# Patient Record
Sex: Female | Born: 1990 | Race: White | Hispanic: No | Marital: Married | Smoking: Never smoker
Health system: Southern US, Community
[De-identification: ages and names within clinical notes are randomized; demographics above are authoritative.]

## PROBLEM LIST (undated history)

## (undated) ENCOUNTER — Emergency Department (HOSPITAL_COMMUNITY): Admission: EM | Payer: Self-pay | Source: Home / Self Care

## (undated) DIAGNOSIS — Z789 Other specified health status: Secondary | ICD-10-CM

---
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
## 2010-09-29 NOTE — Miscellaneous (Signed)
Summary: Vaccine Record/[facility]  Vaccine Record/[facility]   Imported By: Lanelle Bal 01/14/2010 10:00:27  _____________________________________________________________________  External Attachment:    Type:   Image     Comment:   External Document

## 2010-09-29 NOTE — Letter (Signed)
Summary: Immunization Form/[facility]  Immunization Form/[facility]   Imported By: Lanelle Bal 01/14/2010 09:59:39  _____________________________________________________________________  External Attachment:    Type:   Image     Comment:   External Document
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
## 2014-05-01 LAB — OB RESULTS CONSOLE RUBELLA ANTIBODY, IGM: Rubella: UNDETERMINED

## 2014-05-01 LAB — OB RESULTS CONSOLE ABO/RH: RH Type: POSITIVE

## 2014-05-01 LAB — OB RESULTS CONSOLE HEPATITIS B SURFACE ANTIGEN: Hepatitis B Surface Ag: NEGATIVE

## 2014-05-01 LAB — OB RESULTS CONSOLE RPR: RPR: NONREACTIVE

## 2014-05-01 LAB — OB RESULTS CONSOLE ANTIBODY SCREEN: Antibody Screen: NEGATIVE

## 2014-05-01 LAB — OB RESULTS CONSOLE HIV ANTIBODY (ROUTINE TESTING): HIV: NONREACTIVE

## 2014-05-01 LAB — OB RESULTS CONSOLE GC/CHLAMYDIA
CHLAMYDIA, DNA PROBE: NEGATIVE
Gonorrhea: NEGATIVE

## 2014-05-21 ENCOUNTER — Encounter (HOSPITAL_COMMUNITY): Payer: Self-pay | Admitting: Emergency Medicine

## 2014-05-21 ENCOUNTER — Emergency Department (HOSPITAL_COMMUNITY)
Admission: EM | Admit: 2014-05-21 | Discharge: 2014-05-21 | Disposition: A | Discharge status: Home / Self Care | Payer: BC Managed Care – PPO | Attending: Emergency Medicine | Admitting: Emergency Medicine

## 2014-05-21 DIAGNOSIS — S59919A Unspecified injury of unspecified forearm, initial encounter: Secondary | ICD-10-CM

## 2014-05-21 DIAGNOSIS — S40812A Abrasion of left upper arm, initial encounter: Secondary | ICD-10-CM

## 2014-05-21 DIAGNOSIS — S59909A Unspecified injury of unspecified elbow, initial encounter: Secondary | ICD-10-CM | POA: Insufficient documentation

## 2014-05-21 DIAGNOSIS — Y9389 Activity, other specified: Secondary | ICD-10-CM | POA: Insufficient documentation

## 2014-05-21 DIAGNOSIS — S6990XA Unspecified injury of unspecified wrist, hand and finger(s), initial encounter: Secondary | ICD-10-CM

## 2014-05-21 DIAGNOSIS — Y92410 Unspecified street and highway as the place of occurrence of the external cause: Secondary | ICD-10-CM | POA: Diagnosis not present

## 2014-05-21 DIAGNOSIS — IMO0002 Reserved for concepts with insufficient information to code with codable children: Secondary | ICD-10-CM | POA: Insufficient documentation

## 2014-05-21 NOTE — ED Notes (Signed)
Spoke in detail with patient regarding followup regarding abdominal pain and vaginal bleeding
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
## 2014-05-21 NOTE — ED Provider Notes (Signed)
Medical screening examination/treatment/procedure(s) were performed by non-physician practitioner and as supervising physician I was immediately available for consultation/collaboration.   Nelia Shi, MD 05/21/14 2037

## 2014-06-30 ENCOUNTER — Encounter (HOSPITAL_COMMUNITY): Payer: Self-pay | Admitting: Emergency Medicine

## 2014-08-29 NOTE — L&D Delivery Note (Signed)
Delivery Note  SVD viable female Apgars 8,8 over intact perineum.  Port wine fluid noted. NICU team in attendance due to prematurity.  Placenta delivered spontaneously intact with 3VC and velamentous insertion.  Also clot on edge of placenta c/w marginal abruption. Good hemostasis noted.  Baby allowed to stay with mother.   PH art was sent.  Mother and baby were doing well.  EBL 300cc  Candice Campavid Kden Wagster, MD

## 2014-11-01 ENCOUNTER — Inpatient Hospital Stay (HOSPITAL_COMMUNITY)
Admission: AD | Admit: 2014-11-01 | Discharge: 2014-11-04 | DRG: 775 | Disposition: A | Discharge status: Home / Self Care | Payer: BLUE CROSS/BLUE SHIELD | Source: Ambulatory Visit | Attending: Obstetrics and Gynecology | Admitting: Obstetrics and Gynecology

## 2014-11-01 ENCOUNTER — Encounter (HOSPITAL_COMMUNITY): Payer: Self-pay | Admitting: *Deleted

## 2014-11-01 DIAGNOSIS — Z3689 Encounter for other specified antenatal screening: Secondary | ICD-10-CM | POA: Insufficient documentation

## 2014-11-01 DIAGNOSIS — Z3A34 34 weeks gestation of pregnancy: Secondary | ICD-10-CM | POA: Diagnosis not present

## 2014-11-01 DIAGNOSIS — O42913 Preterm premature rupture of membranes, unspecified as to length of time between rupture and onset of labor, third trimester: Principal | ICD-10-CM | POA: Diagnosis present

## 2014-11-01 DIAGNOSIS — O42919 Preterm premature rupture of membranes, unspecified as to length of time between rupture and onset of labor, unspecified trimester: Secondary | ICD-10-CM | POA: Insufficient documentation

## 2014-11-01 HISTORY — DX: Other specified health status: Z78.9

## 2014-11-01 LAB — URINALYSIS, ROUTINE W REFLEX MICROSCOPIC
Bilirubin Urine: NEGATIVE
GLUCOSE, UA: NEGATIVE mg/dL
KETONES UR: NEGATIVE mg/dL
Leukocytes, UA: NEGATIVE
NITRITE: NEGATIVE
PH: 6.5 (ref 5.0–8.0)
Protein, ur: NEGATIVE mg/dL
Specific Gravity, Urine: <1.005 — ABNORMAL LOW (ref 1.005–1.030)
Urobilinogen, UA: 0.2 mg/dL (ref 0.0–1.0)

## 2014-11-01 LAB — URINE MICROSCOPIC-ADD ON

## 2014-11-01 LAB — POCT FERN TEST: POCT FERN TEST: POSITIVE

## 2014-11-01 LAB — AMNISURE RUPTURE OF MEMBRANE (ROM) NOT AT ARMC: Amnisure ROM: POSITIVE

## 2014-11-01 NOTE — MAU Provider Note (Signed)
History     CSN: [id number]  Arrival date and time: 11/01/14 2156   First Provider Initiated Contact with Patient 11/01/14 2248      Chief Complaint  Patient presents with  . Contractions   HPI Alyssa Davila 24 y.o. G1P0 @[redacted]w[redacted]d  presents to MAU complaining of contractions that have been consistent since 4pm.  Initially they were 8 minutes apart but with drinking lots of water, they spread out a bit and now have increased again.  She has been having LOF x several days.  She was seen in the office since this started and diagnosed with a yeast infection.  She is using cream for this and continues to feel fluid coming out.  She endorses good fetal movement.  She has seen a few drops of red blood in the toilet when she goes and a small amt on the pad she has been wearing.  She denies dysuria, nausea, vomiting, weakness, headache.   OB History    Gravida Para Term Preterm AB TAB SAB Ectopic Multiple Living   1               History reviewed. No pertinent past medical history.  History reviewed. No pertinent past surgical history.  History reviewed. No pertinent family history.  History  Substance Use Topics  . Smoking status: Never Smoker   . Smokeless tobacco: Not on file  . Alcohol Use: No    Allergies:  Allergies  Allergen Reactions  . Sulfonamide Derivatives     REACTION: Rash, swelling    Prescriptions prior to admission  Medication Sig Dispense Refill Last Dose  . miconazole (MONISTAT 7) 2 % vaginal cream Place 1 Applicatorful vaginally at bedtime.   10/31/2014 at Unknown time  . OVER THE COUNTER MEDICATION Apply 1 application topically 2 (two) times daily. otc ringworm topical cream   05/21/2014 at Unknown time  . Prenatal Vit-FePoly-FA-DHA (SELECT-OB+DHA) 29-1 & 250 MG MISC Take 1 tablet by mouth daily.   11/01/2014 at Unknown time    ROS Pertinent ROS in HPI  Physical Exam   Blood pressure 125/74, pulse 89, temperature 98.5 F (36.9 C), resp. rate 18, height 5\' 2"   (1.575 m), weight 142 lb 3.2 oz (64.501 kg).  Physical Exam  Constitutional: She is oriented to person, place, and time. She appears well-developed and well-nourished. No distress.  HENT:  Head: Atraumatic.  Eyes: EOM are normal.  Neck: Normal range of motion.  Cardiovascular: Normal rate and regular rhythm.   Respiratory: Effort normal and breath sounds normal. No respiratory distress.  GI: Soft. Bowel sounds are normal. She exhibits no distension. There is no tenderness. There is no rebound and no guarding.  Genitourinary:  Pooling of bloody liquid noted in vault.  After this was removed, pt was asked to cough and further blood was seen exiting cervical os.  Pooling noted of very thin red fluid.   Cervical exam reveals fingertip dilation, 40% effacement  Musculoskeletal: Normal range of motion.  Neurological: She is alert and oriented to person, place, and time.  Skin: Skin is warm and dry.  Psychiatric: She has a normal mood and affect.   Fern POSITIVE MAU Course  Procedures Fetal tracing is reactive without regular contraction pattern.  MDM Katie, RN has discussed with fern positive and bleeding with Dr. Rana SnareLowe [facility].  Amnisure (collected by RN)  was positive also.   MD discussed pt briefly with PA and advises for U/S to confirm fetal positioning.  MD aware  of high census in NICU necessitating consult with neonatology prior to admit. [place] confirms - Vertex.   RN to discuss with Dr. Rana Snare for admit Assessment and Plan  A: Rupture of membranes  P: Admit to L&D  Bertram Denver 11/01/2014, 10:50 PM

## 2014-11-01 NOTE — MAU Note (Addendum)
Contractions since 1600. Drank a lot of water and spaced out some but now about apart. Some spotting for last couple days since being checked at office. . Currently being treated for yeast infecton. States has been leaking fld for awhile. Told doctor Thurs and was told everything ok. Has u/s and fld level normal

## 2014-11-02 ENCOUNTER — Inpatient Hospital Stay (HOSPITAL_COMMUNITY): Payer: BLUE CROSS/BLUE SHIELD | Admitting: Anesthesiology

## 2014-11-02 ENCOUNTER — Inpatient Hospital Stay (HOSPITAL_COMMUNITY): Payer: BLUE CROSS/BLUE SHIELD

## 2014-11-02 ENCOUNTER — Encounter (HOSPITAL_COMMUNITY): Payer: Self-pay | Admitting: *Deleted

## 2014-11-02 DIAGNOSIS — Z3A34 34 weeks gestation of pregnancy: Secondary | ICD-10-CM | POA: Diagnosis present

## 2014-11-02 DIAGNOSIS — O42913 Preterm premature rupture of membranes, unspecified as to length of time between rupture and onset of labor, third trimester: Principal | ICD-10-CM

## 2014-11-02 LAB — RPR: RPR Ser Ql: NONREACTIVE

## 2014-11-02 LAB — CBC
HCT: 32.9 % — ABNORMAL LOW (ref 36.0–46.0)
Hemoglobin: 10.9 g/dL — ABNORMAL LOW (ref 12.0–15.0)
MCH: 29.4 pg (ref 26.0–34.0)
MCHC: 33.1 g/dL (ref 30.0–36.0)
MCV: 88.7 fL (ref 78.0–100.0)
Platelets: 233 10*3/uL (ref 150–400)
RBC: 3.71 MIL/uL — AB (ref 3.87–5.11)
RDW: 12 % (ref 11.5–15.5)
WBC: 13.5 10*3/uL — ABNORMAL HIGH (ref 4.0–10.5)

## 2014-11-02 LAB — TYPE AND SCREEN
ABO/RH(D): O POS
Antibody Screen: NEGATIVE

## 2014-11-02 LAB — ABO/RH: ABO/RH(D): O POS

## 2014-11-02 IMAGING — US US OB LIMITED
1 series · 13 of 17 positions shown · non-contrast
Comparison: none

[Series 1: us ob limited · 0.24mm/px · 13 of 17 slices shown]
[im 1/17]
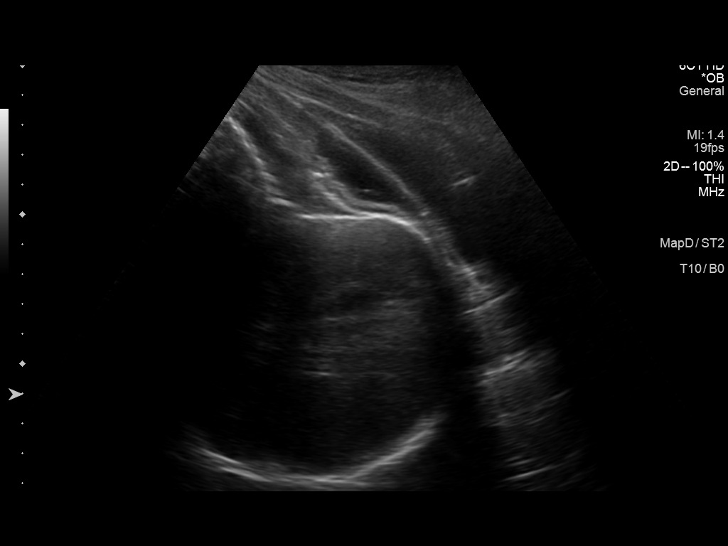
[im 2/17]
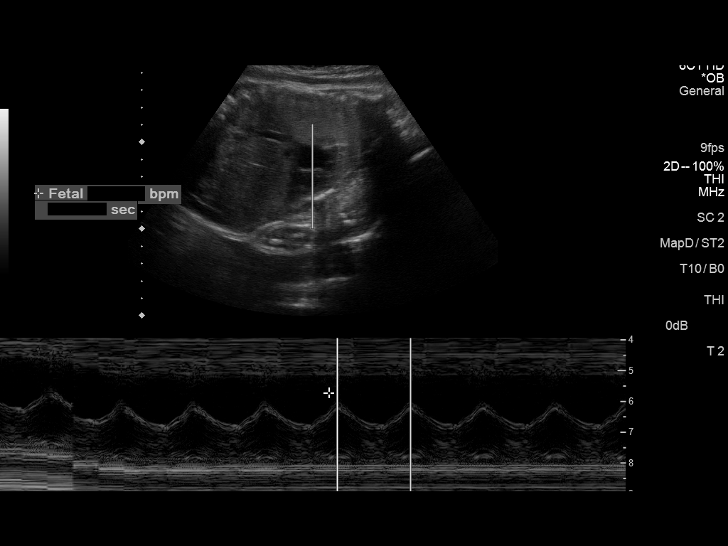
[im 4/17]
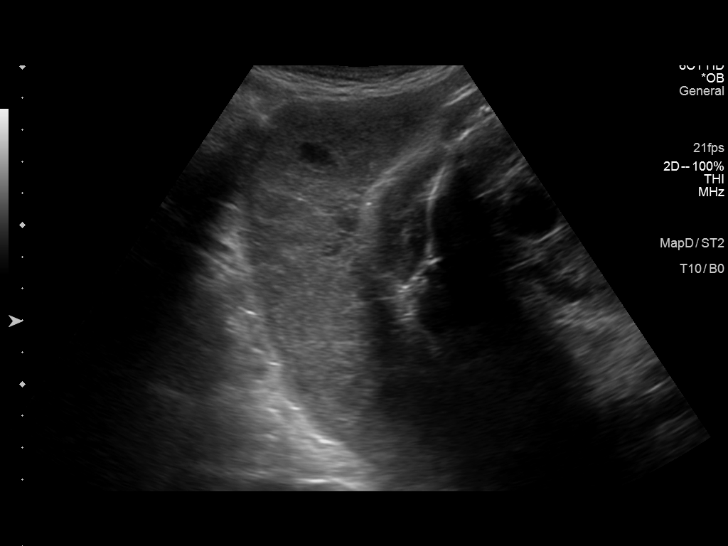
[im 5/17]
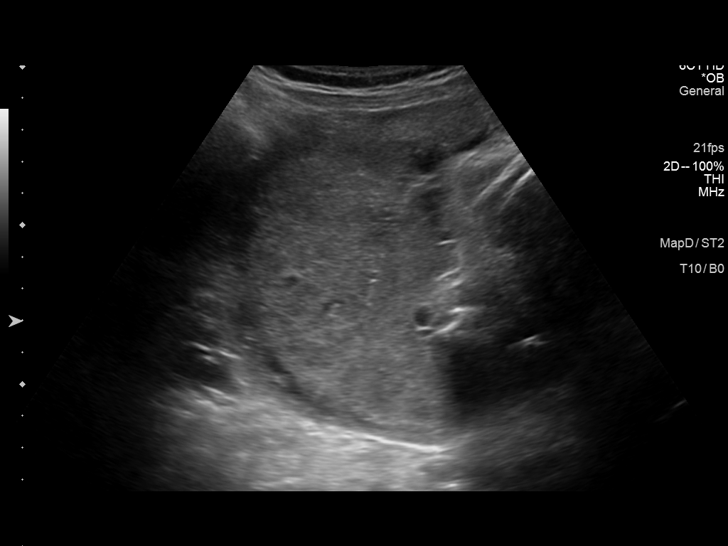
[im 6/17]
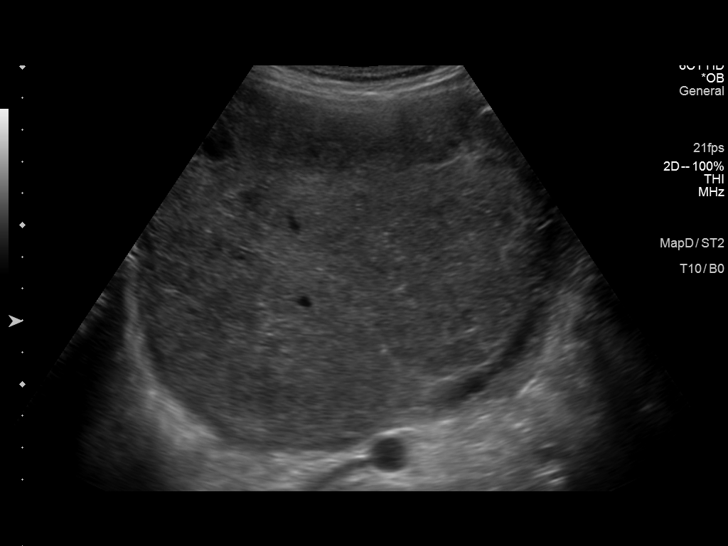
[im 8/17]
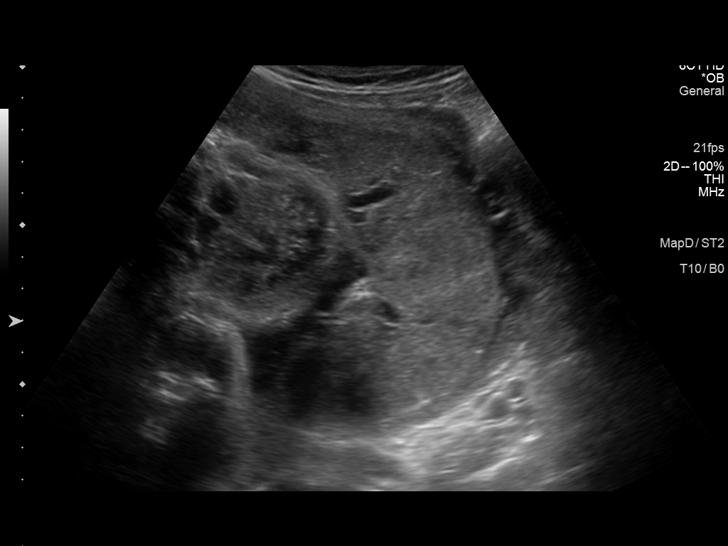
[im 9/17]
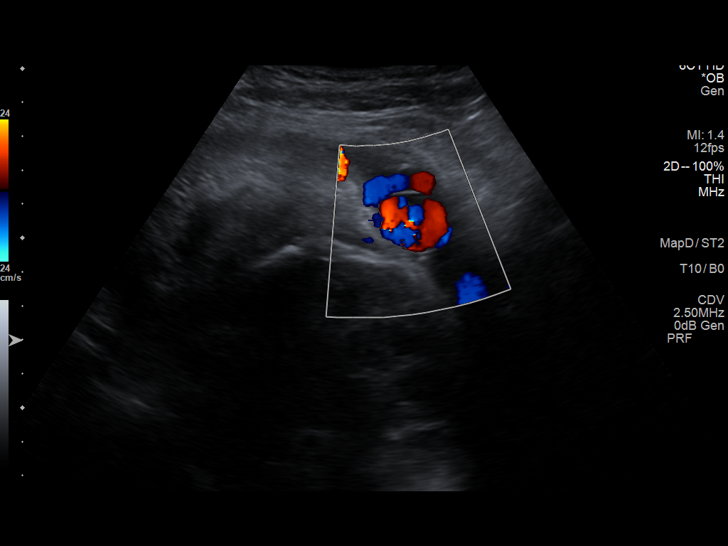
[im 10/17]
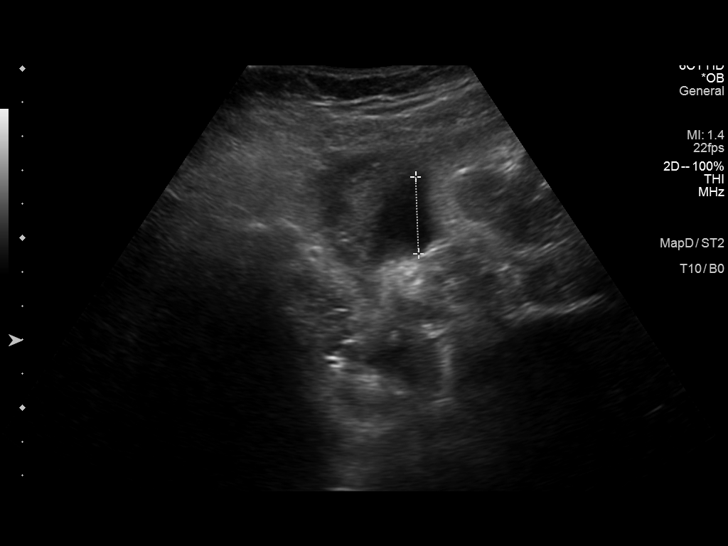
[im 12/17]
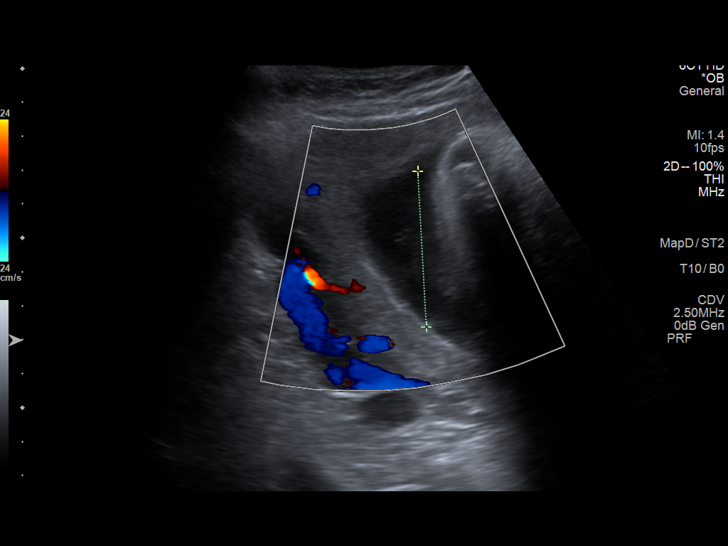
[im 13/17]
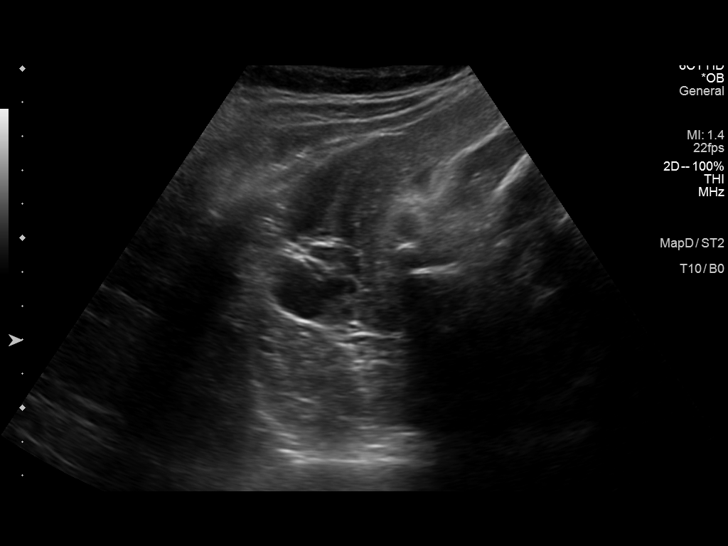
[im 14/17]
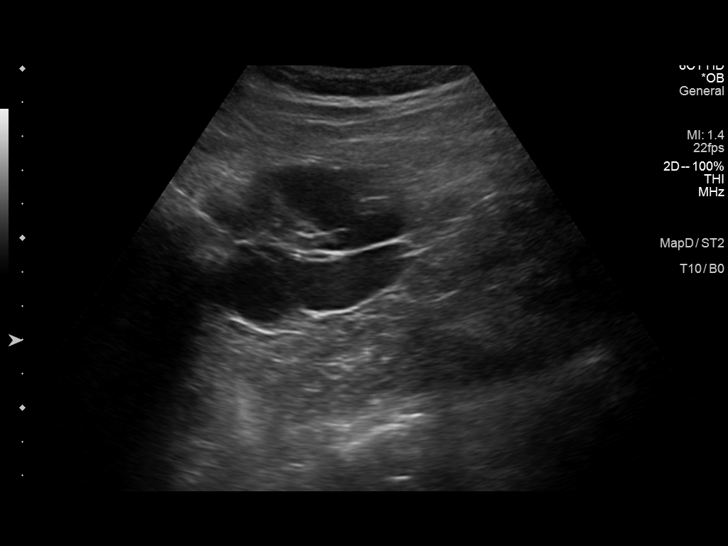
[im 16/17]
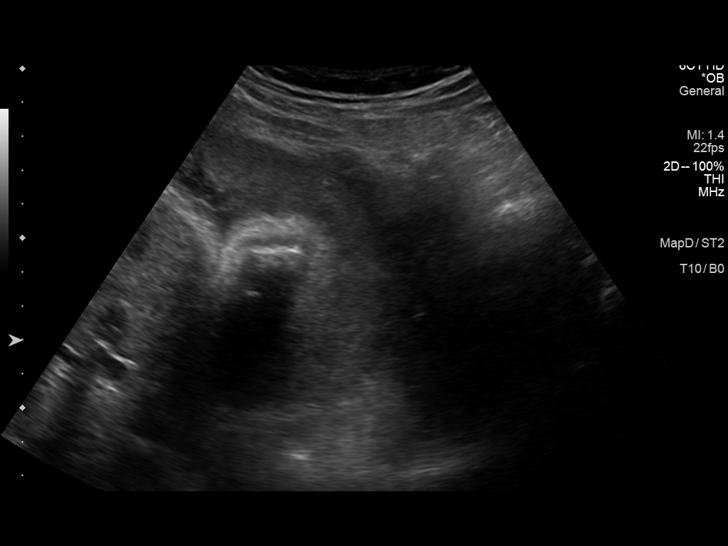
[im 17/17]
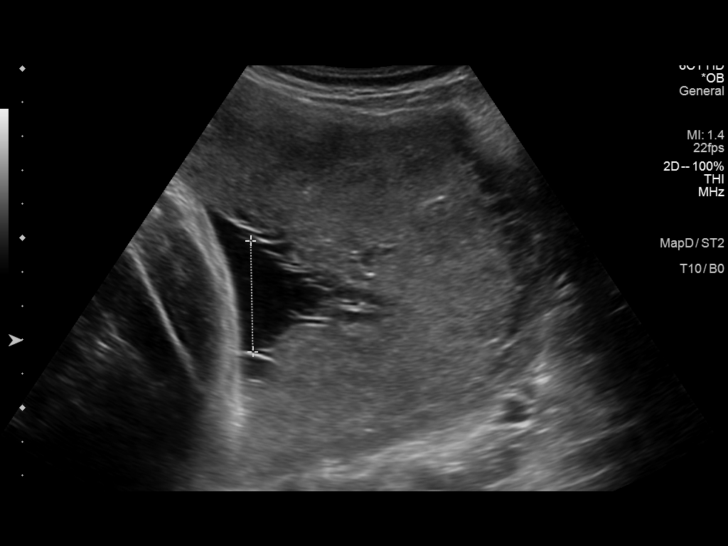

[13 of 17 positions shown; findings below may reference images not displayed]

OBSTETRICS REPORT
                      (Signed Final [DATE] [DATE])

Service(s) Provided

 [HOSPITAL]                                         76815.0
Indications

 Determine fetal presentation using ultrasound         Z36
 Premature rupture of membranes - leaking fluid        [TS]
 (positive fern & Amnisure)
 34 weeks gestation of pregnancy
Fetal Evaluation

 Num Of Fetuses:    1
 Fetal Heart Rate:  149                          bpm
 Cardiac Activity:  Observed
 Presentation:      Cephalic
 Placenta:          Fundal, above cervical os

 Amniotic Fluid
 AFI FV:      Subjectively within normal limits
 AFI Sum:     10.12   cm       21  %Tile     Larg Pckt:    4.59  cm
 RUQ:   4.59    cm   RLQ:    2.26   cm    LUQ:   3.27    cm   LLQ:    0      cm
Gestational Age

 Clinical EDD:  34w 6d                                        EDD:   [DATE]
 Best:          34w 6d     Det. By:  Clinical EDD             EDD:   [DATE]
Cervix Uterus Adnexa

 Cervix:       Not visualized (advanced GA >[TS])
 Uterus:       No abnormality visualized.

 Left Ovary:    Not visualized.
 Right Ovary:   Not visualized.
 Adnexa:     No abnormality visualized.
Impression

 SIUP at 34 w 6d
 cephalic presentation
 no previa
 AFI is normal
Recommendations

 Follow up as clinically indicated.

 questions or concerns.

## 2014-11-02 MED ORDER — TERBUTALINE SULFATE 1 MG/ML IJ SOLN
0.2500 mg | Freq: Once | INTRAMUSCULAR | Status: DC | PRN
  Filled 2014-11-02: qty 1

## 2014-11-02 MED ORDER — ONDANSETRON HCL 4 MG/2ML IJ SOLN: 4.0000 mg | Freq: Four times a day (QID) | INTRAMUSCULAR | Status: DC | PRN

## 2014-11-02 MED ORDER — BENZOCAINE-MENTHOL 20-0.5 % EX AERO
1.0000 "application " | INHALATION_SPRAY | CUTANEOUS | Status: DC | PRN
  Filled 2014-11-02: qty 56

## 2014-11-02 MED ORDER — ONDANSETRON HCL 4 MG PO TABS: 4.0000 mg | ORAL_TABLET | ORAL | Status: DC | PRN

## 2014-11-02 MED ORDER — MEDROXYPROGESTERONE ACETATE 150 MG/ML IM SUSP: 150.0000 mg | INTRAMUSCULAR | Status: DC | PRN

## 2014-11-02 MED ORDER — LACTATED RINGERS IV SOLN
500.0000 mL | Freq: Once | INTRAVENOUS | Status: AC
  Administered 2014-11-02: 500 mL via INTRAVENOUS

## 2014-11-02 MED ORDER — PRENATAL MULTIVITAMIN CH
1.0000 | ORAL_TABLET | Freq: Every day | ORAL | Status: DC
  Administered 2014-11-03: 1 via ORAL
  Filled 2014-11-02: qty 1

## 2014-11-02 MED ORDER — EPHEDRINE 5 MG/ML INJ
10.0000 mg | INTRAVENOUS | Status: DC | PRN
  Filled 2014-11-02: qty 2

## 2014-11-02 MED ORDER — IBUPROFEN 600 MG PO TABS
600.0000 mg | ORAL_TABLET | Freq: Four times a day (QID) | ORAL | Status: DC
  Administered 2014-11-02 – 2014-11-04 (×6): 600 mg via ORAL
  Filled 2014-11-02 (×6): qty 1

## 2014-11-02 MED ORDER — FLEET ENEMA 7-19 GM/118ML RE ENEM: 1.0000 | ENEMA | RECTAL | Status: DC | PRN

## 2014-11-02 MED ORDER — CITRIC ACID-SODIUM CITRATE 334-500 MG/5ML PO SOLN: 30.0000 mL | ORAL | Status: DC | PRN

## 2014-11-02 MED ORDER — OXYTOCIN BOLUS FROM INFUSION: 500.0000 mL | INTRAVENOUS | Status: DC

## 2014-11-02 MED ORDER — SENNOSIDES-DOCUSATE SODIUM 8.6-50 MG PO TABS
2.0000 | ORAL_TABLET | ORAL | Status: DC
  Administered 2014-11-02 – 2014-11-04 (×2): 2 via ORAL
  Filled 2014-11-02 (×2): qty 2

## 2014-11-02 MED ORDER — LACTATED RINGERS IV SOLN
INTRAVENOUS | Status: DC
  Administered 2014-11-02 (×2): via INTRAVENOUS

## 2014-11-02 MED ORDER — DIBUCAINE 1 % RE OINT: 1.0000 "application " | TOPICAL_OINTMENT | RECTAL | Status: DC | PRN

## 2014-11-02 MED ORDER — OXYCODONE-ACETAMINOPHEN 5-325 MG PO TABS: 1.0000 | ORAL_TABLET | ORAL | Status: DC | PRN

## 2014-11-02 MED ORDER — WITCH HAZEL-GLYCERIN EX PADS: 1.0000 "application " | MEDICATED_PAD | CUTANEOUS | Status: DC | PRN

## 2014-11-02 MED ORDER — BUTORPHANOL TARTRATE 1 MG/ML IJ SOLN
1.0000 mg | INTRAMUSCULAR | Status: DC | PRN
  Administered 2014-11-02: 1 mg via INTRAVENOUS
  Filled 2014-11-02: qty 1

## 2014-11-02 MED ORDER — SIMETHICONE 80 MG PO CHEW: 80.0000 mg | CHEWABLE_TABLET | ORAL | Status: DC | PRN

## 2014-11-02 MED ORDER — LANOLIN HYDROUS EX OINT: TOPICAL_OINTMENT | CUTANEOUS | Status: DC | PRN

## 2014-11-02 MED ORDER — OXYTOCIN 40 UNITS IN LACTATED RINGERS INFUSION - SIMPLE MED: 62.5000 mL/h | INTRAVENOUS | Status: DC

## 2014-11-02 MED ORDER — OXYCODONE-ACETAMINOPHEN 5-325 MG PO TABS: 2.0000 | ORAL_TABLET | ORAL | Status: DC | PRN

## 2014-11-02 MED ORDER — TETANUS-DIPHTH-ACELL PERTUSSIS 5-2.5-18.5 LF-MCG/0.5 IM SUSP: 0.5000 mL | Freq: Once | INTRAMUSCULAR | Status: DC

## 2014-11-02 MED ORDER — PHENYLEPHRINE 40 MCG/ML (10ML) SYRINGE FOR IV PUSH (FOR BLOOD PRESSURE SUPPORT)
80.0000 ug | PREFILLED_SYRINGE | INTRAVENOUS | Status: DC | PRN
  Filled 2014-11-02: qty 2
  Filled 2014-11-02: qty 20

## 2014-11-02 MED ORDER — PHENYLEPHRINE 40 MCG/ML (10ML) SYRINGE FOR IV PUSH (FOR BLOOD PRESSURE SUPPORT)
80.0000 ug | PREFILLED_SYRINGE | INTRAVENOUS | Status: DC | PRN
  Filled 2014-11-02: qty 2

## 2014-11-02 MED ORDER — DIPHENHYDRAMINE HCL 50 MG/ML IJ SOLN: 12.5000 mg | INTRAMUSCULAR | Status: DC | PRN

## 2014-11-02 MED ORDER — ACETAMINOPHEN 325 MG PO TABS: 650.0000 mg | ORAL_TABLET | ORAL | Status: DC | PRN

## 2014-11-02 MED ORDER — MEASLES, MUMPS & RUBELLA VAC ~~LOC~~ INJ
0.5000 mL | INJECTION | Freq: Once | SUBCUTANEOUS | Status: AC
  Administered 2014-11-04: 0.5 mL via SUBCUTANEOUS
  Filled 2014-11-02 (×2): qty 0.5

## 2014-11-02 MED ORDER — FENTANYL 2.5 MCG/ML BUPIVACAINE 1/10 % EPIDURAL INFUSION (WH - ANES)
14.0000 mL/h | INTRAMUSCULAR | Status: DC | PRN
  Administered 2014-11-02 (×2): 14 mL/h via EPIDURAL
  Filled 2014-11-02 (×2): qty 125

## 2014-11-02 MED ORDER — OXYTOCIN 40 UNITS IN LACTATED RINGERS INFUSION - SIMPLE MED
1.0000 m[IU]/min | INTRAVENOUS | Status: DC
  Administered 2014-11-02: 2 m[IU]/min via INTRAVENOUS
  Filled 2014-11-02: qty 1000

## 2014-11-02 MED ORDER — ZOLPIDEM TARTRATE 5 MG PO TABS: 5.0000 mg | ORAL_TABLET | Freq: Every evening | ORAL | Status: DC | PRN

## 2014-11-02 MED ORDER — DIPHENHYDRAMINE HCL 25 MG PO CAPS: 25.0000 mg | ORAL_CAPSULE | Freq: Four times a day (QID) | ORAL | Status: DC | PRN

## 2014-11-02 MED ORDER — ONDANSETRON HCL 4 MG/2ML IJ SOLN: 4.0000 mg | INTRAMUSCULAR | Status: DC | PRN

## 2014-11-02 MED ORDER — FENTANYL 2.5 MCG/ML BUPIVACAINE 1/10 % EPIDURAL INFUSION (WH - ANES)
INTRAMUSCULAR | Status: DC | PRN
  Administered 2014-11-02: 14 mL/h via EPIDURAL

## 2014-11-02 MED ORDER — LIDOCAINE HCL (PF) 1 % IJ SOLN
30.0000 mL | INTRAMUSCULAR | Status: DC | PRN
  Filled 2014-11-02: qty 30

## 2014-11-02 MED ORDER — LACTATED RINGERS IV SOLN: 500.0000 mL | INTRAVENOUS | Status: DC | PRN

## 2014-11-02 MED ORDER — LIDOCAINE HCL (PF) 1 % IJ SOLN
INTRAMUSCULAR | Status: DC | PRN
  Administered 2014-11-02 (×2): 4 mL

## 2014-11-02 MED ORDER — SODIUM CHLORIDE 0.9 % IV SOLN
1.5000 g | Freq: Four times a day (QID) | INTRAVENOUS | Status: DC
  Administered 2014-11-02 (×3): 1.5 g via INTRAVENOUS
  Filled 2014-11-02 (×5): qty 1.5

## 2014-11-02 NOTE — Anesthesia Procedure Notes (Signed)
Epidural Patient location during procedure: OB Start time: 11/02/2014 9:15 AM  Staffing Anesthesiologist: Felipe DroneJUDD, Reet Scharrer JENNETTE Performed by: anesthesiologist   Preanesthetic Checklist Completed: patient identified, site marked, surgical consent, pre-op evaluation, timeout performed, IV checked, risks and benefits discussed and monitors and equipment checked  Epidural Patient position: sitting Prep: site prepped and draped and DuraPrep Patient monitoring: continuous pulse ox and blood pressure Approach: midline Location: L3-L4 Injection technique: LOR saline  Needle:  Needle type: Tuohy  Needle gauge: 17 G Needle length: 9 cm and 9 Needle insertion depth: 5 cm cm Catheter type: closed end flexible Catheter size: 19 Gauge Catheter at skin depth: 10 cm Test dose: negative  Assessment Events: blood not aspirated, injection not painful, no injection resistance, negative IV test and no paresthesia  Additional Notes Patient identified. Risks/Benefits/Options discussed with patient including but not limited to bleeding, infection, nerve damage, paralysis, failed block, incomplete pain control, headache, blood pressure changes, nausea, vomiting, reactions to medication both or allergic, itching and postpartum back pain. Confirmed with bedside nurse the patient's most recent platelet count. Confirmed with patient that they are not currently taking any anticoagulation, have any bleeding history or any family history of bleeding disorders. Patient expressed understanding and wished to proceed. All questions were answered. Sterile technique was used throughout the entire procedure. Please see nursing notes for vital signs. Test dose was given through epidural catheter and negative prior to continuing to dose epidural or start infusion. Warning signs of high block given to the patient including shortness of breath, tingling/numbness in hands, complete motor block, or any concerning symptoms with  instructions to call for help. Patient was given instructions on fall risk and not to get out of bed. All questions and concerns addressed with instructions to call with any issues or inadequate analgesia.

## 2014-11-02 NOTE — Progress Notes (Signed)
Dr Rana SnareLowe notified u/s results of baby being vtx. Admit orders received

## 2014-11-02 NOTE — Anesthesia Preprocedure Evaluation (Addendum)
Anesthesia Evaluation  Patient identified by MRN, date of birth, ID band Patient awake    Reviewed: Allergy & Precautions, NPO status , Patient's Chart, lab work & pertinent test results  History of Anesthesia Complications Negative for: history of anesthetic complications  Airway Mallampati: II  TM Distance: >3 FB Neck ROM: Full    Dental no notable dental hx. (+) Dental Advisory Given   Pulmonary neg pulmonary ROS,  breath sounds clear to auscultation  Pulmonary exam normal       Cardiovascular negative cardio ROS  Rhythm:Regular Rate:Normal     Neuro/Psych negative neurological ROS  negative psych ROS   GI/Hepatic negative GI ROS, Neg liver ROS,   Endo/Other  negative endocrine ROS  Renal/GU negative Renal ROS  negative genitourinary   Musculoskeletal negative musculoskeletal ROS (+)   Abdominal   Peds negative pediatric ROS (+)  Hematology negative hematology ROS (+)   Anesthesia Other Findings   Reproductive/Obstetrics (+) Pregnancy                             Anesthesia Physical Anesthesia Plan  ASA: II  Anesthesia Plan: Epidural   Post-op Pain Management:    Induction:   Airway Management Planned:   Additional Equipment:   Intra-op Plan:   Post-operative Plan:   Informed Consent: I have reviewed the patients History and Physical, chart, labs and discussed the procedure including the risks, benefits and alternatives for the proposed anesthesia with the patient or authorized representative who has indicated his/her understanding and acceptance.   Dental advisory given  Plan Discussed with:   Anesthesia Plan Comments:         Anesthesia Quick Evaluation  

## 2014-11-02 NOTE — MAU Note (Signed)
[facility] and pt may come to [facility]

## 2014-11-02 NOTE — H&P (Signed)
Alyssa MoellerChristina Davila is a 24 y.o. female presenting for with leaking of fluid off and on for 3 days.  Seen in office 3 days ago with neg eval including US.  Last night, presented to MAU and amnisure and fern positive.  Admitted for IOL.  GBS unknown.Marland Kitchen. History OB History    Gravida Para Term Preterm AB TAB SAB Ectopic Multiple Living   1              Past Medical History  Diagnosis Date  . Medical history non-contributory    History reviewed. No pertinent past surgical history. Family History: family history is not on file. Social History:  reports that she has never smoked. She does not have any smokeless tobacco history on file. She reports that she does not drink alcohol or use illicit drugs.   Prenatal Transfer Tool  Maternal Diabetes: No Genetic Screening: Normal Maternal Ultrasounds/Referrals: Normal Fetal Ultrasounds or other Referrals:  None Maternal Substance Abuse:  No Significant Maternal Medications:  None Significant Maternal Lab Results:  None Other Comments:  None  ROS  Dilation: 3.5 Effacement (%): 100 Station: -1 Exam by:: S Nix RN Blood pressure 120/73, pulse 69, temperature 97.6 F (36.4 C), temperature source Oral, resp. rate 18, height 5\' 2"  (1.575 m), weight 142 lb (64.411 kg). Exam Physical Exam  Prenatal labs: ABO, Rh: --/--/O POS, O POS (03/06 0050) Antibody: NEG (03/06 0050) Rubella: Equivocal (09/03 0000) RPR: Nonreactive (09/03 0000)  HBsAg: Negative (09/03 0000)  HIV: Non-reactive (09/03 0000)  GBS:     Assessment/Plan: IUP at 34 6/7 weeks with PROM IV abx for unknown GBS and ? PPROM Pitocin IOL.  NICU alerted.  Anticipate SVD.  No sxs of chorio   Alyssa Davila C 11/02/2014, 9:03 AM

## 2014-11-03 DIAGNOSIS — O42919 Preterm premature rupture of membranes, unspecified as to length of time between rupture and onset of labor, unspecified trimester: Secondary | ICD-10-CM | POA: Insufficient documentation

## 2014-11-03 DIAGNOSIS — Z3689 Encounter for other specified antenatal screening: Secondary | ICD-10-CM | POA: Insufficient documentation

## 2014-11-03 DIAGNOSIS — Z3A34 34 weeks gestation of pregnancy: Secondary | ICD-10-CM | POA: Insufficient documentation

## 2014-11-03 LAB — CBC
HCT: 27.3 % — ABNORMAL LOW (ref 36.0–46.0)
HEMOGLOBIN: 9.1 g/dL — AB (ref 12.0–15.0)
MCH: 29.6 pg (ref 26.0–34.0)
MCHC: 33.3 g/dL (ref 30.0–36.0)
MCV: 88.9 fL (ref 78.0–100.0)
PLATELETS: 193 10*3/uL (ref 150–400)
RBC: 3.07 MIL/uL — ABNORMAL LOW (ref 3.87–5.11)
RDW: 12.1 % (ref 11.5–15.5)
WBC: 17.4 10*3/uL — ABNORMAL HIGH (ref 4.0–10.5)

## 2014-11-03 NOTE — Anesthesia Postprocedure Evaluation (Signed)
  Anesthesia Post-op Note  Patient: Alyssa Davila  Procedure(s) Performed: * No procedures listed *  Patient Location: Mother/Baby  Anesthesia Type:Epidural  Level of Consciousness: awake, oriented and patient cooperative  Airway and Oxygen Therapy: Patient Spontanous Breathing  Post-op Pain: none  Post-op Assessment: Post-op Vital signs reviewed, Patient's Cardiovascular Status Stable, Respiratory Function Stable, Patent Airway, No signs of Nausea or vomiting, Adequate PO intake, Pain level controlled, No headache, No backache, No residual numbness and No residual motor weakness  Post-op Vital Signs: Reviewed and stable  Last Vitals:  Filed Vitals:   11/03/14 0635  BP: 116/51  Pulse: 55  Temp: 36.7 C  Resp: 16    Complications: No apparent anesthesia complications

## 2014-11-03 NOTE — Lactation Note (Signed)
This note was copied from the chart of Alyssa Marvis MoellerChristina Riester. Lactation Consultation Note  Patient Name: Alyssa Davila WUJWJ'XToday's Date: 11/03/2014   Visited with Mom, baby 2418 hrs old.   Baby has had one feeding at the breast using a #16 NS, for 20 minutes.  Mom has primarily been doing skin to skin, and bottle feeding using a slow flow nipple (5-10 ml).  Mom double pumping at present, using the "premie" setting on pump.  Expressed 9 ml of colostrum with this pumping.  Baby getting her bath presently, and Mom to give baby her feeding by bottle, and hold her skin to skin.  Reassured Mom that baby was acting very normal for her gestation, and that it would be a process of her growing in size, and closer to 38 weeks adjusted gestation.  Mom very relaxed and comfortable with plan.  Offered assistance if needed with a feeding at the breast, to call for help.  To follow up in am, or prn.  Judee ClaraSmith, Medford Staheli E 11/03/2014, 12:49 PM

## 2014-11-03 NOTE — Lactation Note (Addendum)
This note was copied from the chart of Alyssa Marvis MoellerChristina Whisonant. Lactation Consultation Note New mom LPI 34 6/7 weeks. Wt. 5.1oz  Mom has small breast w/adequate breast tissue. DEBP set up by nursery RN, mom pumped for 15 min. Mom knows to pump q3h for 15-20 min. Mom shown how to use DEBP & how to disassemble, clean, & reassemble parts.1 1/2 oz.! Hand expression demonstrated and encouraged afterwards. Labels obtained from pharmacy to label bottles. LPI information sheet w/supplement guidelines given. Reviewed LPI care and LPI newborn behavior and special needs.  Since mom had pumped so much, will refrigerate and label. Kept 5ml colostrum and bottled the rest. Has small nipples. Fitted #16 NS d/t baby will not obtain deep latch d/t not compressible at this time. Application and care demonstrated as well as information sheet given. Mom given shells and encouraged to wear them between feedings.   Mom encouraged to feed baby 8-12 times/24 hours and with feeding cues. Mom encouraged to do skin-to-skin.Mom encouraged to waken baby for feeds.  Referred to Baby and Me Book in Breastfeeding section Pg. 22-23 for position options and Proper latch demonstration. WH/LC brochure given w/resources, support groups and LC services. Patient Name: Alyssa Davila WGNFA'OToday's Date: 11/03/2014 Reason for consult: Initial assessment   Maternal Data Has patient been taught Hand Expression?: Yes Does the patient have breastfeeding experience prior to this delivery?: No  Feeding Feeding Type: Bottle Fed - Formula Nipple Type: Slow - flow  LATCH Score/Interventions       Type of Nipple: Everted at rest and after stimulation (small short shaft nipples)  Comfort (Breast/Nipple): Soft / non-tender     Intervention(s): Breastfeeding basics reviewed;Support Pillows;Position options;Skin to skin     Lactation Tools Discussed/Used Tools: Shells;Nipple Dorris CarnesShields;Pump Nipple shield size: 16 Shell Type: Inverted Breast  pump type: Double-Electric Breast Pump Pump Review: Setup, frequency, and cleaning;Milk Storage   Consult Status Consult Status: Follow-up Date: 11/03/14 (in pm) Follow-up type: In-patient    Kylar Leonhardt, Diamond NickelLAURA G 11/03/2014, 1:40 AM

## 2014-11-03 NOTE — Progress Notes (Signed)
Post Partum Day 1 Subjective: no complaints, up ad lib, voiding and tolerating PO  Objective: Blood pressure 116/51, pulse 55, temperature 98 F (36.7 C), temperature source Oral, resp. rate 16, height 5\' 2"  (1.575 m), weight 142 lb (64.411 kg), SpO2 100 %, unknown if currently breastfeeding.  Physical Exam:  General: alert and cooperative Lochia: appropriate Uterine Fundus: firm Incision: healing well DVT Evaluation: No evidence of DVT seen on physical exam. Negative Homan's sign. No cords or calf tenderness. No significant calf/ankle edema.   Recent Labs  11/02/14 0050 11/03/14 0600  HGB 10.9* 9.1*  HCT 32.9* 27.3*    Assessment/Plan: Plan for discharge tomorrow   LOS: 1 day   CURTIS,CAROL G 11/03/2014, 8:29 AM

## 2014-11-04 MED ORDER — IBUPROFEN 600 MG PO TABS: 600.0000 mg | ORAL_TABLET | Freq: Four times a day (QID) | ORAL | Status: AC

## 2014-11-04 NOTE — Lactation Note (Signed)
This note was copied from the chart of Alyssa Marvis MoellerChristina Lograsso. Lactation Consultation Note  Patient Name: Alyssa Davila ZOXWR'UToday's Date: 11/04/2014 Reason for consult: Follow-up assessment LPI 41 hours of life. Mom reports baby latching well and nursing well at beginning of the feed, but then tires and becomes sleepy. Baby nursed earlier, but then was supplemented within last hour, so baby not cueing to nurse now. Reviewed LPI behavior and special care. Enc parents to limit total feeds to 30 minutes. Enc nursing first, supplementing with EBM and then formula if needed according to supplementation guidelines. Enc mom to post-pump after each nursing session to have EBM reading for next feeding. Enc parents to nurse with cues, and at least every 3 hours. Discussed ways of assessing transfer at breast. Enc mom to hand express EBM into NS at start of feeding. Referred parents to Baby and Me booklet for number of diapers to expect by day of life and EBM storage guidelines. Engorgement prevention/treatment discussed. Mom had DEBP at home. Mom aware that NS a temporary device, and how to attempt to transition away from its use. Parents aware of OP/BFSG and LC phone line assistance after D/C. Enc parents to call for assistance as needed.     Maternal Data    Feeding Feeding Type: Breast Fed Nipple Type: Slow - flow Length of feed: 5 min  LATCH Score/Interventions                      Lactation Tools Discussed/Used     Consult Status Consult Status: Complete    [name] 11/04/2014, 11:04 AM

## 2014-11-04 NOTE — Discharge Summary (Signed)
Obstetric Discharge Summary Reason for Admission: rupture of membranes Prenatal Procedures: ultrasound Intrapartum Procedures: spontaneous vaginal delivery Postpartum Procedures: none Complications-Operative and Postpartum: 1 degree perineal laceration HEMOGLOBIN  Date Value Ref Range Status  11/03/2014 9.1* 12.0 - 15.0 g/dL Final   HCT  Date Value Ref Range Status  11/03/2014 27.3* 36.0 - 46.0 % Final    Physical Exam:  General: alert and cooperative Lochia: appropriate Uterine Fundus: firm Incision: healing well DVT Evaluation: No evidence of DVT seen on physical exam. Negative Homan's sign. No cords or calf tenderness. No significant calf/ankle edema.  Discharge Diagnoses: s/p vag delivery at 35 weeks  Discharge Information: Date: 11/04/2014 Activity: pelvic rest Diet: routine Medications: PNV and Ibuprofen Condition: stable Instructions: refer to practice specific booklet Discharge to: home   Newborn Data: Live born female  Birth Weight: 5 lb 2.5 oz (2339 g) APGAR: 8, 8  Home with mother.  Allison Silva G 11/04/2014, 8:31 AM

## 2014-11-05 ENCOUNTER — Ambulatory Visit: Payer: Self-pay

## 2014-11-05 NOTE — Lactation Note (Signed)
This note was copied from the chart of Alyssa Davila. Lactation Consultation Note  Patient Name: Alyssa Marvis MoellerChristina Musolf [id number]'s Date: 11/05/2014 Reason for consult: Follow-up assessment LPI 62 hours of life. Baby patient stayed for feeding and bilirubin monitoring. Parents reports they feel more comfortable with feeding, stating that they are watching for feeding cues, and then watching for the baby to tire at breast. Mom is then providing all EBM for supplementation. Enc parents to call out for assistance with next feeding for LC to see latch.   Maternal Data    Feeding Feeding Type: Breast Fed Length of feed: 6 min  LATCH Score/Interventions                      Lactation Tools Discussed/Used     Consult Status Consult Status: PRN    Geralynn OchsWILLIARD, Katreena Schupp 11/05/2014, 8:25 AM

## 2014-11-05 NOTE — Lactation Note (Signed)
This note was copied from the chart of Alyssa Davila Gist. Lactation Consultation Note  Patient Name: Alyssa Davila Kroening WUJWJ'XToday's Date: 11/05/2014 Reason for consult: Follow-up assessment LPI, baby patient, 63 hours of life. Assessed baby at breast. Baby latched deeply, using #16 NS, suckled rhythmically with intermittent swallows noted. Mom effectively massaged breast prior to applying NS, then hand expressed transitional milk into NS, then latched baby in football position. Mom given a "back-up" #16 NS. Parents aware of nursing with cues, and at least every 3 hours. Parents aware of OP/BFSG and LC phone line assistance after D/C. Maternal Data    Feeding Feeding Type: Breast Fed Length of feed:  (Assessed first 10 minutes of BF.)  LATCH Score/Interventions Latch: Grasps breast easily, tongue down, lips flanged, rhythmical sucking.  Audible Swallowing: Spontaneous and intermittent  Type of Nipple: Everted at rest and after stimulation  Comfort (Breast/Nipple): Soft / non-tender     Hold (Positioning): No assistance needed to correctly position infant at breast.  LATCH Score: 10  Lactation Tools Discussed/Used Nipple shield size: 16   Consult Status Consult Status: Complete    Nao Linz 11/05/2014, 9:01 AM

## 2014-12-16 ENCOUNTER — Other Ambulatory Visit: Payer: Self-pay | Admitting: Obstetrics and Gynecology

## 2014-12-19 LAB — CYTOLOGY - PAP
